# Patient Record
Sex: Female | Born: 1975 | Race: Black or African American | Hispanic: No | Marital: Single | Smoking: Never smoker
Health system: Southern US, Community
[De-identification: ages and names within clinical notes are randomized; demographics above are authoritative.]

---

## 2014-06-18 ENCOUNTER — Emergency Department (HOSPITAL_COMMUNITY)
Admission: EM | Admit: 2014-06-18 | Discharge: 2014-06-18 | Disposition: A | Discharge status: Home / Self Care | Payer: Medicaid Other | Attending: Emergency Medicine | Admitting: Emergency Medicine

## 2014-06-18 ENCOUNTER — Encounter (HOSPITAL_COMMUNITY): Payer: Self-pay | Admitting: Emergency Medicine

## 2014-06-18 DIAGNOSIS — M65832 Other synovitis and tenosynovitis, left forearm: Secondary | ICD-10-CM | POA: Insufficient documentation

## 2014-06-18 DIAGNOSIS — R200 Anesthesia of skin: Secondary | ICD-10-CM | POA: Diagnosis present

## 2014-06-18 DIAGNOSIS — M778 Other enthesopathies, not elsewhere classified: Secondary | ICD-10-CM

## 2014-06-18 DIAGNOSIS — R202 Paresthesia of skin: Secondary | ICD-10-CM | POA: Insufficient documentation

## 2014-06-18 MED ORDER — IBUPROFEN 400 MG PO TABS
800.0000 mg | ORAL_TABLET | Freq: Once | ORAL | Status: AC
  Administered 2014-06-18: 800 mg via ORAL
  Filled 2014-06-18: qty 2

## 2014-06-18 MED ORDER — IBUPROFEN 800 MG PO TABS: 800.0000 mg | ORAL_TABLET | Freq: Three times a day (TID) | ORAL | Status: DC | PRN

## 2014-06-18 MED ORDER — CYCLOBENZAPRINE HCL 10 MG PO TABS: 10.0000 mg | ORAL_TABLET | Freq: Three times a day (TID) | ORAL | Status: DC | PRN

## 2014-06-18 NOTE — ED Notes (Signed)
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  One week ago she began experiencing numbness and pain radiating from her elbows down to her hands.  Also experiencing neuropathy to pads of fingers.

## 2014-06-18 NOTE — Discharge Instructions (Signed)
Read the information below.  Use the prescribed medication as directed.  Please discuss all new medications with your pharmacist.  You may return to the Emergency Department at any time for worsening condition or any new symptoms that concern you.  If you develop uncontrolled pain, weakness or numbness of the extremity, severe discoloration of the skin, or you are unable to move your hands, return to the ER for a recheck.       Paresthesia Paresthesia is a burning or prickling feeling. This feeling can happen in any part of the body. It often happens in the hands, arms, legs, or feet. HOME CARE  Avoid drinking alcohol.  Try massage or needle therapy (acupuncture) to help with your problems.  Keep all doctor visits as told. GET HELP RIGHT AWAY IF:   You feel weak.  You have trouble walking or moving.  You have problems speaking or seeing.  You feel confused.  You cannot control when you poop (bowel movement) or pee (urinate).  You lose feeling (numbness) after an injury.  You pass out (faint).  Your burning or prickling feeling gets worse when you walk.  You have pain, cramps, or feel dizzy.  You have a rash. MAKE SURE YOU:   Understand these instructions.  Will watch your condition.  Will get help right away if you are not doing well or get worse. Document Released: 07/23/2008 Document Revised: 11/02/2011 Document Reviewed: 05/01/2011 [facility] Patient Information 2015 [place], [place]. This information is not intended to replace advice given to you by your health care provider. Make sure you discuss any questions you have with your health care provider.

## 2014-06-18 NOTE — ED Notes (Signed)
Called ortho and they are on their way with elbow brace.

## 2014-06-18 NOTE — ED Provider Notes (Signed)
Medical screening examination/treatment/procedure(s) were performed by non-physician practitioner and as supervising physician I was immediately available for consultation/collaboration.   EKG Interpretation None       Jacquelene Kopecky, MD 06/18/14 1734 

## 2014-06-18 NOTE — Progress Notes (Signed)
Orthopedic Tech Progress Note Patient Details:  Connie Kelly Sep 16, 1975 [id number] Applied tennis elbow brace to LUE. Ortho Devices Type of Ortho Device: Other (comment) Ortho Device/Splint Location: LUE Ortho Device/Splint Interventions: Application   Lesle ChrisGilliland, Ron Junco L 06/18/2014, 12:06 PM

## 2014-06-18 NOTE — ED Notes (Signed)
C/O bilateral hand numbness, left elbow and wrist pain, since starting new job 2 weeks ago.

## 2014-06-18 NOTE — ED Provider Notes (Signed)
CSN: [id number]     Arrival date & time 06/18/14  [id number] History  This chart was scribed for non-physician practitioner Trixie DredgeEmily Victoriana Aziz, PA-C working with Doug SouSam Jacubowitz, MD by Leone PayorSonum Patel, ED Scribe. This patient was seen in room TR05C/TR05C and the patient's care was started at 10:06 AM.    Chief Complaint  Patient presents with  . bil arm numbness    The history is provided by the patient. No language interpreter was used.   HPI Comments: Connie Kelly is a 38 y.o. female who presents to the Emergency Department complaining of 1 week of gradual onset, intermittent, gradually worsened bilateral elbow pain (L>R) that has progressed into elbow pain with radiation into wrists with associated altered sensations to the pads of all 10 fingers. She reports working at a [employer] which requires her to use repetitive motions. She denies recent falls or injuries. She has not taken any OTC medications for her symptoms. She denies prior joint pain, polyuria, polydipsia, neck pain. Denies fevers, weakness of the hands.  History reviewed. No pertinent past medical history. History reviewed. No pertinent past surgical history. No family history on file. History  Substance Use Topics  . Smoking status: Never Smoker   . Smokeless tobacco: Not on file  . Alcohol Use: No   OB History   Grav Para Term Preterm Abortions TAB SAB Ect Mult Living                 Review of Systems  Constitutional: Negative for fever.  Endocrine: Negative for polydipsia and polyuria.  Musculoskeletal: Positive for arthralgias. Negative for neck pain.  Skin: Negative for color change and wound.  Allergic/Immunologic: Negative for immunocompromised state.  Neurological: Positive for numbness. Negative for weakness.  Hematological: Does not bruise/bleed easily.      Allergies  Review of patient's allergies indicates no known allergies.  Home Medications   Prior to Admission medications   Not on File   BP 148/86   Pulse 73  Temp(Src) 98.6 F (37 C) (Oral)  Resp 20  Ht 5\' 3"  (1.6 m)  Wt 170 lb (77.111 kg)  BMI 30.12 kg/m2  SpO2 100%  LMP 06/07/2014 Physical Exam  Nursing note and vitals reviewed. Constitutional: She appears well-developed and well-nourished. No distress.  HENT:  Head: Normocephalic and atraumatic.  Neck: Neck supple.  Cardiovascular: Intact distal pulses.   Pulmonary/Chest: Effort normal.  Musculoskeletal: Normal range of motion. She exhibits no edema and no tenderness.       Arms: Upper extremities:  Strength 5/5, sensation intact but altered throughout the left hand, distal pulses intact.    Tinnel and phalen tests increased symptoms, but in the 1st and 2nd fingers.    Neurological: She is alert.  Skin: She is not diaphoretic.    ED Course  Procedures (including critical care time)  DIAGNOSTIC STUDIES: Oxygen Saturation is 100% on RA, normal by my interpretation.    COORDINATION OF CARE: 10:12 AM Discussed treatment plan with pt at bedside and pt agreed to plan.   Labs Review Labs Reviewed - No data to display  Imaging Review No results found.   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  Suspect lateral epicondylitis vs nerve impingement.  The tingling in her hands is not dermatomal.  Vascularly intact.   D/C home with motrin, flexeril , PCP follow up, hand surgery follow up for worsening symptoms.  Discussed result, findings, treatment, and follow up  with patient.  Pt given return precautions.  Pt verbalizes understanding and agrees with plan.       I personally performed the services described in this documentation, which was scribed in  my presence. The recorded information has been reviewed and is accurate.   Trixie Dredgemily Chidera Dearcos, PA-C 06/18/14 1537  Rio OsoEmily Alyda Megna, [place] JerseyPA-C 06/18/14 1537

## 2015-03-07 ENCOUNTER — Emergency Department (HOSPITAL_COMMUNITY)
Admission: EM | Admit: 2015-03-07 | Discharge: 2015-03-07 | Disposition: A | Discharge status: Home / Self Care | Payer: Medicaid Other | Attending: Emergency Medicine | Admitting: Emergency Medicine

## 2015-03-07 ENCOUNTER — Encounter (HOSPITAL_COMMUNITY): Payer: Self-pay | Admitting: Neurology

## 2015-03-07 DIAGNOSIS — M546 Pain in thoracic spine: Secondary | ICD-10-CM | POA: Insufficient documentation

## 2015-03-07 DIAGNOSIS — R109 Unspecified abdominal pain: Secondary | ICD-10-CM | POA: Diagnosis not present

## 2015-03-07 LAB — COMPREHENSIVE METABOLIC PANEL
ALK PHOS: 40 U/L (ref 38–126)
ALT: 14 U/L (ref 14–54)
AST: 16 U/L (ref 15–41)
Albumin: 3.8 g/dL (ref 3.5–5.0)
Anion gap: 6 (ref 5–15)
BUN: 10 mg/dL (ref 6–20)
CO2: 24 mmol/L (ref 22–32)
Calcium: 9.3 mg/dL (ref 8.9–10.3)
Chloride: 109 mmol/L (ref 101–111)
Creatinine, Ser: 0.97 mg/dL (ref 0.44–1.00)
GFR calc non Af Amer: >60 mL/min (ref >60)
GLUCOSE: 92 mg/dL (ref 65–99)
Potassium: 4.4 mmol/L (ref 3.5–5.1)
SODIUM: 139 mmol/L (ref 135–145)
TOTAL PROTEIN: 6.6 g/dL (ref 6.5–8.1)
Total Bilirubin: 0.5 mg/dL (ref 0.3–1.2)

## 2015-03-07 LAB — CBC
HCT: 36.4 % (ref 36.0–46.0)
Hemoglobin: 11.7 g/dL — ABNORMAL LOW (ref 12.0–15.0)
MCH: 29.9 pg (ref 26.0–34.0)
MCHC: 32.1 g/dL (ref 30.0–36.0)
MCV: 93.1 fL (ref 78.0–100.0)
Platelets: 244 10*3/uL (ref 150–400)
RBC: 3.91 MIL/uL (ref 3.87–5.11)
RDW: 13.9 % (ref 11.5–15.5)
WBC: 4.9 10*3/uL (ref 4.0–10.5)

## 2015-03-07 LAB — I-STAT BETA HCG BLOOD, ED (MC, WL, AP ONLY): I-stat hCG, quantitative: <5 m[IU]/mL (ref <5)

## 2015-03-07 LAB — LIPASE, BLOOD: Lipase: 24 U/L (ref 22–51)

## 2015-03-07 MED ORDER — KETOROLAC TROMETHAMINE 60 MG/2ML IM SOLN
60.0000 mg | Freq: Once | INTRAMUSCULAR | Status: AC
  Administered 2015-03-07: 60 mg via INTRAMUSCULAR
  Filled 2015-03-07: qty 2

## 2015-03-07 MED ORDER — NAPROXEN 500 MG PO TABS: 500.0000 mg | ORAL_TABLET | Freq: Two times a day (BID) | ORAL | Status: AC

## 2015-03-07 NOTE — ED Provider Notes (Signed)
CSN: [id number]     Arrival date & time 03/07/15  1543 History  This chart was scribed for Joycie PeekBenjamin Paisley Grajeda, PA-C, working with Mirian MoMatthew Gentry, MD by Chestine SporeSoijett Blue, ED Scribe. The patient was seen in room TR02C/TR02C at 6:52 PM.    Chief Complaint  Patient presents with  . Back Pain      The history is provided by the patient. No language interpreter was used.    HPI Comments: Connie Slipperonia Hartsfield is a 39 y.o. female who presents to the Emergency Department complaining of constant left middle back pain onset yesterday. She reports that the left middle back pain does radiate to her LUQ. Pt reports that it feels like a muscle spasm and she tried to go to work but the pain was too much. Pt notes that when she breathes in there is pain to the area. Pt reports that she has back pain with movement and rates her pain as 8/10. She states that she has tried ibuprofen last night with no relief for her symptoms. Pt denies leg swelling, hemoptysis, and any other symptoms. Denies recent travels, surgeries, taking birth control, and blood clots.    History reviewed. No pertinent past medical history. History reviewed. No pertinent past surgical history. No family history on file. History  Substance Use Topics  . Smoking status: Never Smoker   . Smokeless tobacco: Not on file  . Alcohol Use: No   OB History    No data available     Review of Systems  Cardiovascular: Negative for leg swelling.  Gastrointestinal: Positive for abdominal pain. Negative for vomiting.  Genitourinary: Negative for difficulty urinating.  Musculoskeletal: Positive for back pain.  All other systems reviewed and are negative.     Allergies  Peanut-containing drug products and Percocet  Home Medications   Prior to Admission medications   Medication Sig Start Date End Date Taking? Authorizing Provider  ibuprofen (ADVIL,MOTRIN) 200 MG tablet Take 400 mg by mouth every 6 (six) hours as needed for mild pain.   Yes Historical  Provider, MD  naproxen (NAPROSYN) 500 MG tablet Take 1 tablet (500 mg total) by mouth 2 (two) times daily. 03/07/15   Joycie PeekBenjamin Kenyan Karnes, PA-C   BP 140/74 mmHg  Pulse 72  Temp(Src) 98.4 F (36.9 C) (Oral)  Resp 20  SpO2 100%  LMP 02/22/2015 Physical Exam  Constitutional: She is oriented to person, place, and time. She appears well-developed and well-nourished. No distress.  HENT:  Head: Normocephalic and atraumatic.  Eyes: EOM are normal.  Neck: Neck supple. No tracheal deviation present.  Cardiovascular: Normal rate, regular rhythm and normal heart sounds.  Exam reveals no gallop and no friction rub.   No murmur heard. Pulmonary/Chest: Effort normal and breath sounds normal. No respiratory distress. She has no wheezes. She has no rales.  Abdominal: Soft. There is no tenderness.  Musculoskeletal: Normal range of motion.  TTP in mid thoracic paraspinal muscles and intercostal spaces. No crepitus or bony step-offs.  Neurological: She is alert and oriented to person, place, and time.  Skin: Skin is warm and dry.  No rash noted.   Psychiatric: She has a normal mood and affect. Her behavior is normal.  Nursing note and vitals reviewed.   ED Course  Procedures (including critical care time) DIAGNOSTIC STUDIES: Oxygen Saturation is 100% on RA, nl by my interpretation.    COORDINATION OF CARE: 6:56 PM-Discussed treatment plan which includes with pt at bedside and pt agreed to plan.   Labs Review  Labs Reviewed  CBC - Abnormal; Notable for the following:    Hemoglobin 11.7 (*)    All other components within normal limits  LIPASE, BLOOD  COMPREHENSIVE METABOLIC PANEL  URINALYSIS, ROUTINE W REFLEX MICROSCOPIC (NOT AT [facility])  I-STAT BETA HCG BLOOD, ED ([facility], [facility], [facility] ONLY)    Imaging Review No results found.   EKG Interpretation None     Meds given in ED:  Medications  ketorolac (TORADOL) injection 60 mg (not administered)    New Prescriptions   NAPROXEN (NAPROSYN) 500 MG  TABLET    Take 1 tablet (500 mg total) by mouth 2 (two) times daily.   Filed Vitals:   03/07/15 1606  BP: 140/74  Pulse: 72  Temp: 98.4 F (36.9 C)  TempSrc: Oral  Resp: 20  SpO2: 100%    MDM  Vitals stable - WNL -afebrile Pt resting comfortably in ED. PE--physical exam is not concerning it is consistent with musculoskeletal injury. Labwork--labs not concerning  DDX--exam consistent with musculoskeletal etiology. No evidence of shingles, pyelonephritis, other cardiopulmonary pathology. Patient is PERC negative. Likely costochondritis versus other musculoskeletal injury. Will DC with anti-inflammatory's and instructions to follow-up with primary care.  I discussed all relevant lab findings and imaging results with pt and they verbalized understanding. Discussed f/u with PCP within 48 hrs and return precautions, pt very amenable to plan.  Final diagnoses:  Left-sided thoracic back pain  I personally performed the services described in this documentation, which was scribed in my presence. The recorded information has been reviewed and is accurate.       Joycie Peek, PA-C 03/07/15 1937  Mirian Mo, MD 03/08/15 [phone]

## 2015-03-07 NOTE — Discharge Instructions (Signed)
Please take your medications as prescribed. Follow-up with your doctors for further evaluation. Return to ED for any worsening symptoms.  Back Pain, Adult Low back pain is very common. About 1 in 5 people have back pain.The cause of low back pain is rarely dangerous. The pain often gets better over time.About half of people with a sudden onset of back pain feel better in just 2 weeks. About 8 in 10 people feel better by 6 weeks.  CAUSES Some common causes of back pain include:  Strain of the muscles or ligaments supporting the spine.  Wear and tear (degeneration) of the spinal discs.  Arthritis.  Direct injury to the back. DIAGNOSIS Most of the time, the direct cause of low back pain is not known.However, back pain can be treated effectively even when the exact cause of the pain is unknown.Answering your caregiver's questions about your overall health and symptoms is one of the most accurate ways to make sure the cause of your pain is not dangerous. If your caregiver needs more information, he or she may order lab work or imaging tests (X-rays or MRIs).However, even if imaging tests show changes in your back, this usually does not require surgery. HOME CARE INSTRUCTIONS For many people, back pain returns.Since low back pain is rarely dangerous, it is often a condition that people can learn to [facility] their own.   Remain active. It is stressful on the back to sit or stand in one place. Do not sit, drive, or stand in one place for more than 30 minutes at a time. Take short walks on level surfaces as soon as pain allows.Try to increase the length of time you walk each day.  Do not stay in bed.Resting more than 1 or 2 days can delay your recovery.  Do not avoid exercise or work.Your body is made to move.It is not dangerous to be active, even though your back may hurt.Your back will likely heal faster if you return to being active before your pain is gone.  Pay attention to your body  when you bend and lift. Many people have less discomfortwhen lifting if they bend their knees, keep the load close to their bodies,and avoid twisting. Often, the most comfortable positions are those that put less stress on your recovering back.  Find a comfortable position to sleep. Use a firm mattress and lie on your side with your knees slightly bent. If you lie on your back, put a pillow under your knees.  Only take over-the-counter or prescription medicines as directed by your caregiver. Over-the-counter medicines to reduce pain and inflammation are often the most helpful.Your caregiver may prescribe muscle relaxant drugs.These medicines help dull your pain so you can more quickly return to your normal activities and healthy exercise.  Put ice on the injured area.  Put ice in a plastic bag.  Place a towel between your skin and the bag.  Leave the ice on for 15-20 minutes, 03-04 times a day for the first 2 to 3 days. After that, ice and heat may be alternated to reduce pain and spasms.  Ask your caregiver about trying back exercises and gentle massage. This may be of some benefit.  Avoid feeling anxious or stressed.Stress increases muscle tension and can worsen back pain.It is important to recognize when you are anxious or stressed and learn ways to manage it.Exercise is a great option. SEEK MEDICAL CARE IF:  You have pain that is not relieved with rest or medicine.  You have  pain that does not improve in 1 week.  You have new symptoms.  You are generally not feeling well. SEEK IMMEDIATE MEDICAL CARE IF:   You have pain that radiates from your back into your legs.  You develop new bowel or bladder control problems.  You have unusual weakness or numbness in your arms or legs.  You develop nausea or vomiting.  You develop abdominal pain.  You feel faint. Document Released: 08/10/2005 Document Revised: 02/09/2012 Document Reviewed: 12/12/2013 [facility] Patient  Information 2015 [place], [place]. This information is not intended to replace advice given to you by your health care provider. Make sure you discuss any questions you have with your health care provider.

## 2015-03-07 NOTE — ED Notes (Addendum)
Pt reports left middle back pain with spasms that radiate to LUQ. Pain is in LUQat current. Denies vomiting. Pt is a x 4. Has been feeling nauseated.

## 2015-03-18 ENCOUNTER — Encounter (HOSPITAL_COMMUNITY): Payer: Self-pay | Admitting: Emergency Medicine

## 2015-03-18 ENCOUNTER — Emergency Department (HOSPITAL_COMMUNITY)
Admission: EM | Admit: 2015-03-18 | Discharge: 2015-03-18 | Disposition: A | Discharge status: Home / Self Care | Payer: Medicaid Other | Attending: Emergency Medicine | Admitting: Emergency Medicine

## 2015-03-18 ENCOUNTER — Emergency Department (HOSPITAL_COMMUNITY): Payer: Medicaid Other

## 2015-03-18 DIAGNOSIS — Y939 Activity, unspecified: Secondary | ICD-10-CM | POA: Diagnosis not present

## 2015-03-18 DIAGNOSIS — S79911A Unspecified injury of right hip, initial encounter: Secondary | ICD-10-CM | POA: Diagnosis present

## 2015-03-18 DIAGNOSIS — S76011A Strain of muscle, fascia and tendon of right hip, initial encounter: Secondary | ICD-10-CM

## 2015-03-18 DIAGNOSIS — Z791 Long term (current) use of non-steroidal anti-inflammatories (NSAID): Secondary | ICD-10-CM | POA: Insufficient documentation

## 2015-03-18 DIAGNOSIS — Y998 Other external cause status: Secondary | ICD-10-CM | POA: Insufficient documentation

## 2015-03-18 DIAGNOSIS — Y929 Unspecified place or not applicable: Secondary | ICD-10-CM | POA: Diagnosis not present

## 2015-03-18 DIAGNOSIS — X58XXXA Exposure to other specified factors, initial encounter: Secondary | ICD-10-CM | POA: Diagnosis not present

## 2015-03-18 MED ORDER — IBUPROFEN 400 MG PO TABS
800.0000 mg | ORAL_TABLET | Freq: Once | ORAL | Status: AC
  Administered 2015-03-18: 800 mg via ORAL
  Filled 2015-03-18: qty 2

## 2015-03-18 MED ORDER — IBUPROFEN 800 MG PO TABS: 800.0000 mg | ORAL_TABLET | Freq: Three times a day (TID) | ORAL | Status: AC

## 2015-03-18 MED ORDER — METHOCARBAMOL 500 MG PO TABS: 1000.0000 mg | ORAL_TABLET | Freq: Two times a day (BID) | ORAL | Status: AC

## 2015-03-18 NOTE — ED Notes (Signed)
Patient states R hip pain that started on Sat and has progressively gotten worse.  Patient complains that R hip hurts with shooting pains to knee.   Patient states no relief at home.

## 2015-03-18 NOTE — Discharge Instructions (Signed)
Hip Pain Your hip is the joint between your upper legs and your lower pelvis. The bones, cartilage, tendons, and muscles of your hip joint perform a lot of work each day supporting your body weight and allowing you to move around. Hip pain can range from a minor ache to severe pain in one or both of your hips. Pain may be felt on the inside of the hip joint near the groin, or the outside near the buttocks and upper thigh. You may have swelling or stiffness as well.  HOME CARE INSTRUCTIONS   Take medicines only as directed by your health care provider.  Apply ice to the injured area:  Put ice in a plastic bag.  Place a towel between your skin and the bag.  Leave the ice on for 15-20 minutes at a time, 3-4 times a day.  Keep your leg raised (elevated) when possible to lessen swelling.  Avoid activities that cause pain.  Follow specific exercises as directed by your health care provider.  Sleep with a pillow between your legs on your most comfortable side.  Record how often you have hip pain, the location of the pain, and what it feels like. SEEK MEDICAL CARE IF:   You are unable to put weight on your leg.  Your hip is red or swollen or very tender to touch.  Your pain or swelling continues or worsens after 1 week.  You have increasing difficulty walking.  You have a fever. SEEK IMMEDIATE MEDICAL CARE IF:   You have fallen.  You have a sudden increase in pain and swelling in your hip. MAKE SURE YOU:   Understand these instructions.  Will watch your condition.  Will get help right away if you are not doing well or get worse. Document Released: 01/28/2010 Document Revised: 12/25/2013 Document Reviewed: 04/06/2013 [facility] Patient Information 2015 [place], [place]. This information is not intended to replace advice given to you by your health care provider. Make sure you discuss any questions you have with your health care provider.   Emergency Department Resource  Guide 1) Find a Doctor and Pay Out of Pocket Although you won't have to find out who is covered by your insurance plan, it is a good idea to ask around and get recommendations. You will then need to call the office and see if the doctor you have chosen will accept you as a new patient and what types of options they offer for patients who are self-pay. Some doctors offer discounts or will set up payment plans for their patients who do not have insurance, but you will need to ask so you aren't surprised when you get to your appointment.  2) Contact Your Local Health Department Not all health departments have doctors that can see patients for sick visits, but many do, so it is worth a call to see if yours does. If you don't know where your local health department is, you can check in your phone book. The CDC also has a tool to help you locate your state's health department, and many state websites also have listings of all of their local health departments.  3) Find a Walk-in Clinic If your illness is not likely to be very severe or complicated, you may want to try a walk in clinic. These are popping up all over the country in pharmacies, drugstores, and shopping centers. They're usually staffed by nurse practitioners or physician assistants that have been trained to treat common illnesses and complaints. They're usually  fairly quick and inexpensive. However, if you have serious medical issues or chronic medical problems, these are probably not your best option.  No Primary Care Doctor: - Call Health Connect at  [phone] - they can help you locate a primary care doctor that  accepts your insurance, provides certain services, etc. - Physician Referral Service- [phone]  Chronic Pain Problems: Organization         Address  Phone   Notes  [facility]  [phone] Patients need to be referred by their primary care doctor.   Medication Assistance: Organization          Address  Phone   Notes  [facility] Medication [facility] [address] [place] [place], [place] [address] [phone] --Must be a resident of [facility] -- Must have NO insurance coverage whatsoever (no Medicaid/ Medicare, etc.) -- The pt. MUST have a primary care doctor that directs their care regularly and follows them in the community   [facility]  [phone]   [employer]  [phone]    Agencies that provide inexpensive medical care: Organization         Address  Phone   Notes  [facility]  [phone]   [facility]    [phone]   [facility] [address] [place], [place] [address] [phone]   [facility] [address] [place] [place] [phone]   Planned Parenthood    [phone]   [facility]    [phone]   [facility]  [address], [place] Phone:  [phone], Fax:  [phone] Hours of Operation:  9 am - 6 pm, M-F.  Also accepts Medicaid/Medicare and self-pay.  [facility]  [address], [place] Phone: [phone], Fax: [phone]. Hours of Operation:  8:30 am - 5:30 pm, M-F.  Also accepts Medicaid and self-pay.  [facility] [place] [address], [place] Phone: [phone]   [facility] [address] Natasha Bence [place], [place] [phone], Ext. [phone] Mondays & Thursdays: 7-9 AM.  First 15 patients are seen on a first come, first serve basis.    Medicaid-accepting [facility] Providers:  Organization         Address  Phone   Notes  [facility] [address], [place] [phone] Also accepts self-pay patients.  [facility] [address] Laurell Josephs [place], [place]  [phone]   [facility] [address], [place] [phone]   [facility] [address] [place] [phone]   Renaye Rakers [address], [place]   [phone] Only accepts [place] Access [place] patients after they have their name applied to their card.   Self-Pay (no insurance) in [facility]:  Organization         Address  Phone   Notes  Sickle Cell Patients, [facility] [address] [place], [place] [phone]   [facility] [address] [place], [place] [phone]   [facility] [place]  [address], [place] [phone]   [facility]/Dr. Osei-Bonsu  [address] [place] or [address], [place] [phone] Phone  number for both [place] and [place] locations is the same.  [facility] [address] [place] [phone]   [facility] [address], [place] or [address] [phone]   [facility] [address] [place] [phone], phone; [phone], fax Sees patients 1st and 3rd Saturday of every month.  Must not qualify for public or private insurance (i.e. Medicaid, Medicare, [place] Health Choice, Veterans' Benefits)  Household income should be no more than 200% of the poverty level The clinic cannot treat you if you are pregnant or think you are pregnant  Sexually transmitted diseases are not treated at the clinic.    Dental Care: Organization         Address  Phone  Notes  [facility] Department of [facility] [address] [place], [place] [phone] Accepts children up to age 56 who are enrolled in [place] or [place] Health Choice; pregnant women with a Medicaid card; and children who have applied for Medicaid or [place] Health Choice, but were declined, whose parents can pay a reduced fee at time of service.  [facility] Department of [facility]  [address], [place] [phone] Accepts children up to age 58 who are enrolled in [place] or [place] Health Choice; pregnant women with a Medicaid card; and children who have applied for Medicaid or [place] Health Choice, but were declined, whose parents can pay a reduced fee at time of service.  [facility]  [address] [place], [place] [phone] Patients are seen by appointment only. Walk-ins are not accepted. [facility] will see patients 4 years of age and older. Monday - Tuesday (8am-5pm) Most Wednesdays (8:30-5pm) $30 per visit, cash only  [facility] Adult Dental Access [facility]  [address], [facility] [phone] Patients are seen by appointment only. Walk-ins are not accepted. [facility] will see patients 46 years of age and older. One Wednesday Evening (Monthly: Volunteer Based).  $30 per visit, cash only  [employer]  [phone] for adults; Children under age 31, call Graduate Pediatric Dentistry at [phone]. Children aged 41-14, please call [phone] to request a pediatric application.  Dental services are provided in all areas of dental care including fillings, crowns and bridges, complete and partial dentures, implants, gum treatment, root canals, and extractions. Preventive care is also provided. Treatment is provided to both adults and children. Patients are selected via a lottery and there is often a waiting list.   [facility] [address] [place] [address], [place]  [phone] [email or web address]   [facility] [address] [place], [place] [phone], Ext. [phone] Second and Fourth Thursday of each month, opens at 6:30 AM; Clinic ends at 9 AM.  Patients are seen on a first-come first-served basis, and a limited number are seen during each clinic.   [facility]  [address] Ether [place], [place] [phone]   Eligibility Requirements You must  have lived in [place], [place], or [place] counties for at least the last three months.   You cannot be eligible for state or federal sponsored [place], including [employer], [place], or [employer].   You generally cannot be eligible for healthcare insurance through your employer.    How to apply: Eligibility screenings are held every Tuesday and Wednesday afternoon from 1:00 pm  until 4:00 pm. You do not need an appointment for the interview!  [facility] [address], [place], [place] [phone]   [facility] Health Department  [phone]   [facility] Health Department  [phone]   [facility] Health Department  [phone]    Behavioral Health Resources in the Community: Intensive Outpatient Programs Organization         Address  Phone  Notes  [place] [facility] [address] [place], [place] [phone]   [facility] Outpatient [address] [place], [place] [phone]   [facility] [address] [place], [place]  [phone]   [facility] Mental Health [address]  [place], [place] [phone] or [phone]   Substance Abuse Resources Organization         Address  Phone  Notes  [facility]  [phone]   [facility]  [phone]   [facility]  [phone]   Floydene Flock  [phone]   Residential & Outpatient Substance Abuse Program  [phone]   Psychological Services Organization         Address  Phone  Notes  [facility] Health  [phone]   [facility] Services  [phone]   [facility] Mental Health [address] [place] [phone] or [phone]    Mobile Crisis Teams Organization         Address  Phone  Notes  [facility], Mobile Crisis Care Unit  [phone]   [facility]  [address] [place], [place] [phone]   Doristine Locks [address] [place] [phone]    Self-Help/Support Groups Organization         Address  Phone             Notes  [facility] [place] - variety of support groups  336- I7437963 Call for more information  Narcotics Anonymous (NA), [facility] [address], [employer] [place]  2 meetings at this location   Statistician         Address  Phone  Notes  [facility] [address],    [place]  [phone]   [facility]  [address] [place] [address], [place], [place] [phone]   [facility] [address] [place], [place] [phone] Admissions: 8am-3pm M-F  [facility] [address],    [place], [place] [phone]   [facility] [address] [place], [place], [place] [phone]   [facility] [address]  [place], [place] [phone]   [facility] - Intensive Outpatient [address] Laurell Joseph[address], [place], [place] [phone]   [facility] ([facility] [address] [place].,  [place], [place] [phone] or [phone]   [facility] ([facility]) [address], [place], [place] [phone] Accepts Medicaid  [facility] [place] [address]  [place] [phone] Substance Abuse/Addiction Treatment   [facility] Organization         Address  Phone  Notes  [facility]  [phone]   Angie Fava, PhD [address] Ervin Knack [place], [place]   [phone] or [phone]   [facility]   [address] [place], [place] [phone]   [facility] [address] [place], [place] [phone] Insurance/Medicaid/sponsorship through [employer] and Families [address] [place] [phone] Therapy/tele-psych/case  [facility] [address]    [place], [place] [phone]    Dr. Lolly Mustache  [phone]   [facility]  [employer] [facility] Dept. 1) [address], [place] 2) [address] [place] 3)  [address], [place] [phone]  [phone]   [facility] Child Abuse Hotline 217 374 1867 or [phone] (After Hours)

## 2015-03-18 NOTE — ED Provider Notes (Signed)
CSN: [id number]     Arrival date & time 03/18/15  0728 History   First MD Initiated Contact with Patient 03/18/15 [phone]     Chief Complaint  Patient presents with  . Hip Pain     (Consider location/radiation/quality/duration/timing/severity/associated sxs/prior Treatment) HPI Patient is a 39 year old female past medical history of present ER complaining of right sided anterior hip pain. Patient reports she has been on her feet at work lately, working more than usual. Reports on Saturday having a gradual onset of anterior hip pain which has since progressed. Patient reports reproduction of her pain with range of motion of her hip, walking, bearing weight. Patient states she is tried ibuprofen without relief. Patient denies any specific injury to her hip, denies fever, nausea, vomiting, numbness, weakness. Patient states she is currently on her menses, states it is no chance she could be pregnant.  History reviewed. No pertinent past medical history. History reviewed. No pertinent past surgical history. No family history on file. History  Substance Use Topics  . Smoking status: Never Smoker   . Smokeless tobacco: Not on file  . Alcohol Use: No   OB History    No data available     Review of Systems  Musculoskeletal: Positive for arthralgias.  Neurological: Negative for weakness and numbness.      Allergies  Peanut-containing drug products and Percocet  Home Medications   Prior to Admission medications   Medication Sig Start Date End Date Taking? Authorizing Provider  ibuprofen (ADVIL,MOTRIN) 800 MG tablet Take 1 tablet (800 mg total) by mouth 3 (three) times daily. 03/18/15   Ladona Mow, PA-C  methocarbamol (ROBAXIN) 500 MG tablet Take 2 tablets (1,000 mg total) by mouth 2 (two) times daily. 03/18/15   Ladona Mow, PA-C  naproxen (NAPROSYN) 500 MG tablet Take 1 tablet (500 mg total) by mouth 2 (two) times daily. 03/07/15   Joycie Peek, PA-C   BP 121/71 mmHg  Pulse 74   Temp(Src) 97.8 F (36.6 C) (Oral)  Resp 20  Ht  (1.6 m)  Wt 160 lb (72.576 kg)  BMI 28.35 kg/m2  SpO2 100%  LMP 03/18/2015 Physical Exam  Constitutional: She appears well-developed and well-nourished. No distress.  HENT:  Head: Normocephalic and atraumatic.  Eyes: Conjunctivae are normal. Right eye exhibits no discharge. Left eye exhibits no discharge. No scleral icterus.  Cardiovascular:  Peripheral pulses intact at injured extremity.   Pulmonary/Chest: Effort normal. No respiratory distress.  Abdominal: No hernia. Hernia confirmed negative in the ventral area, confirmed negative in the right inguinal area and confirmed negative in the left inguinal area.  Musculoskeletal:  Right hip exam: No obvious effusion, erythema, ecchymosis, deformity. Anterior tenderness to palpation in the medial trochanteric region. Patient has painful range of motion of hip, pain exacerbated with external/internal rotation. Pain worsened with flexion and abduction. Patient has 5 out of 5 motor strength at hip, knee, ankle. DP pulse 2+. Distal sensation intact.  Neurological: She is alert.  No numbness distal to injury.    Skin: Skin is warm and dry. No rash noted. She is not diaphoretic.  Nursing note and vitals reviewed.   ED Course  Procedures (including critical care time) Labs Review Labs Reviewed - No data to display  Imaging Review Dg Hip Unilat With Pelvis 2-3 Views Right  03/18/2015   CLINICAL DATA:  Right hip pain, no known injury, initial encounter  EXAM: DG HIP (WITH OR WITHOUT PELVIS) 2-3V RIGHT  COMPARISON:  None.  FINDINGS: The  pelvic ring is intact. No acute fracture or dislocation is seen. No soft tissue changes are noted. Changes of prior tubal ligation are seen.  IMPRESSION: No acute abnormality noted.   Electronically Signed   By: Alcide Clever M.D.   On: 03/18/2015 08:33     EKG Interpretation None      MDM   Final diagnoses:  Strain of flexor muscle of hip, right,  initial encounter    3 days of anterior right-sided hip pain. Reproducible with flexion of hip, external and internal rotation. Patient afebrile, hemodynamically stable and the acute distress.  Likely musculoskeletal strain secondary to overuse injury. No concern for septic arthritis. No concern for gout, no concern for abdominal hernia on exam. X-ray unremarkable for acute pathology. Patient neurovascularly intact. Provided symptomatically relief here, encouraged patient to follow up orthopedics. Return precautions discussed, patient verbalizes understanding and agreement of this plan.  BP 121/71 mmHg  Pulse 74  Temp(Src) 97.8 F (36.6 C) (Oral)  Resp 20  Ht 5\' 3"  (1.6 m)  Wt 160 lb (72.576 kg)  BMI 28.35 kg/m2  SpO2 100%  LMP 03/18/2015  Signed,  Ladona Mow, PA-C 5:24 PM   Ladona Mow, PA-C 03/18/15 1724  Richardean [place], MD 03/18/15 1944

## 2015-05-14 ENCOUNTER — Encounter: Payer: Medicaid Other | Admitting: Family Medicine

## 2015-06-11 ENCOUNTER — Encounter: Payer: Medicaid Other | Admitting: Obstetrics & Gynecology

## 2016-12-16 IMAGING — DX DG HIP (WITH OR WITHOUT PELVIS) 2-3V*R*
3 series · 3 of 3 positions shown · non-contrast
Comparison: None.

CLINICAL DATA: Right hip pain, no known injury, initial encounter

EXAM:
DG HIP (WITH OR WITHOUT PELVIS) 2-3V RIGHT

[pelvis ap]
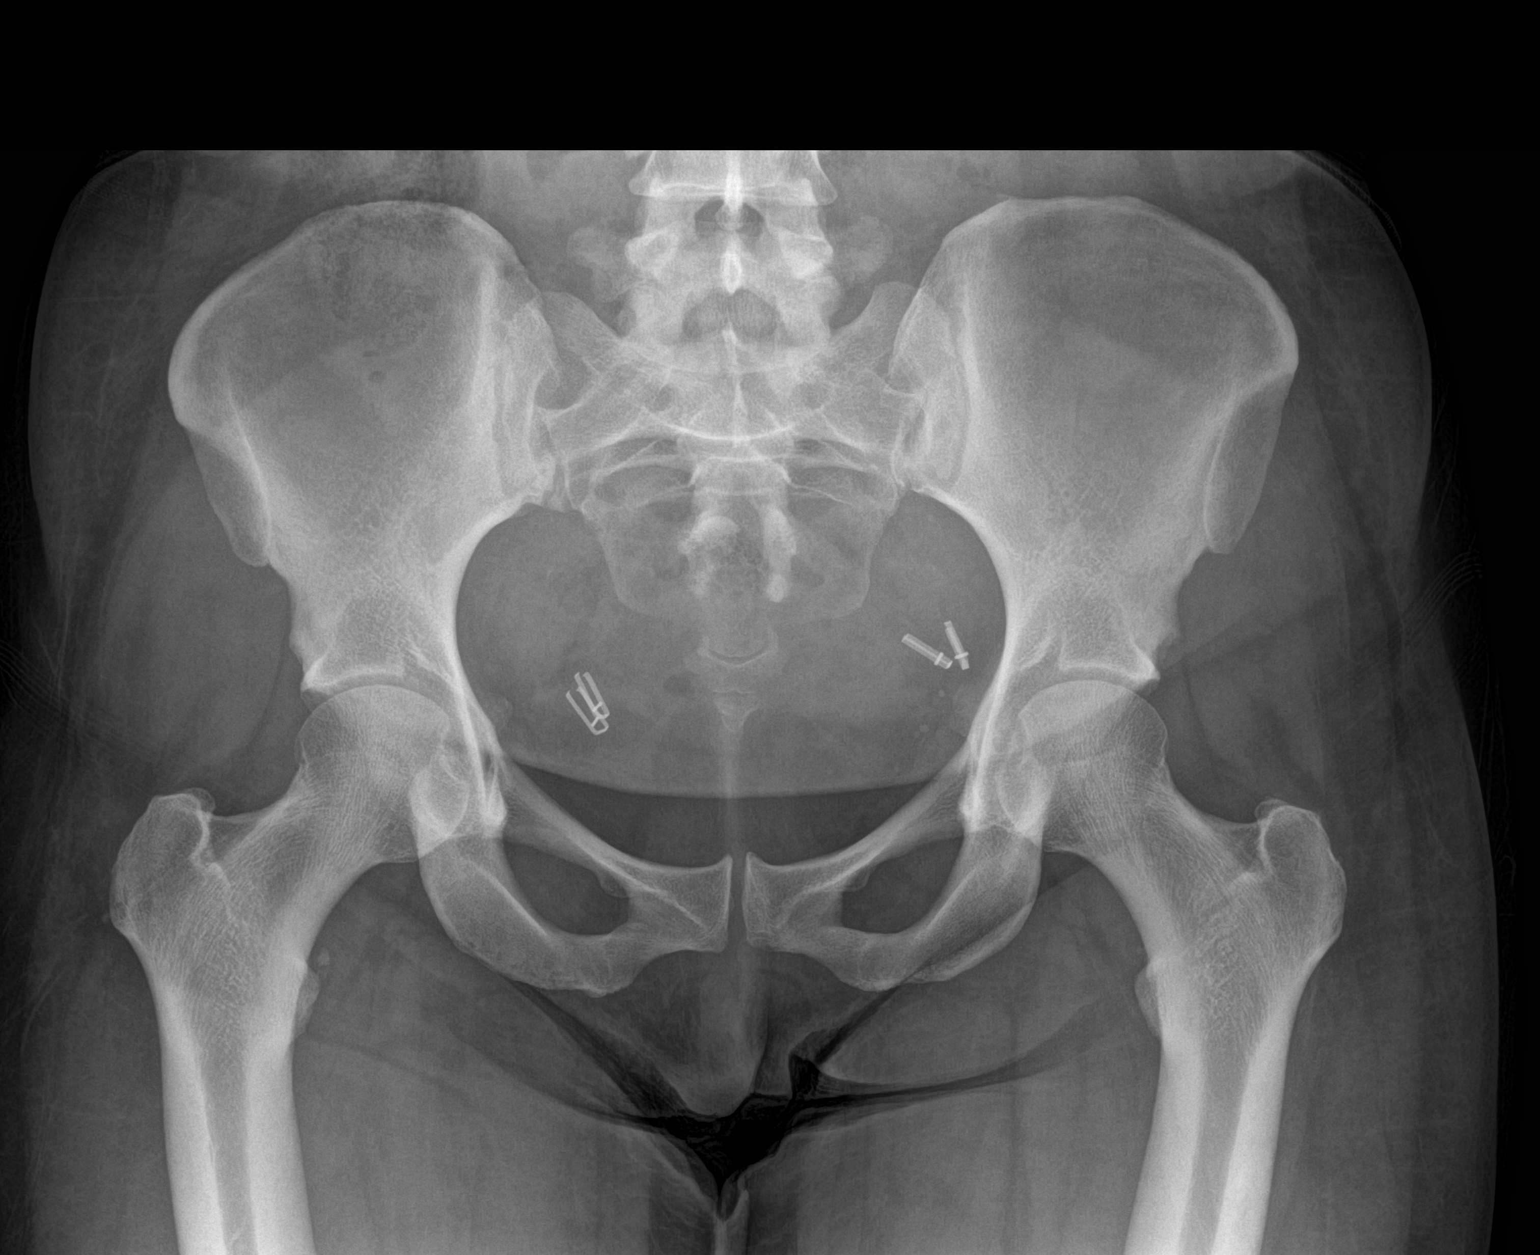

[hip ap]
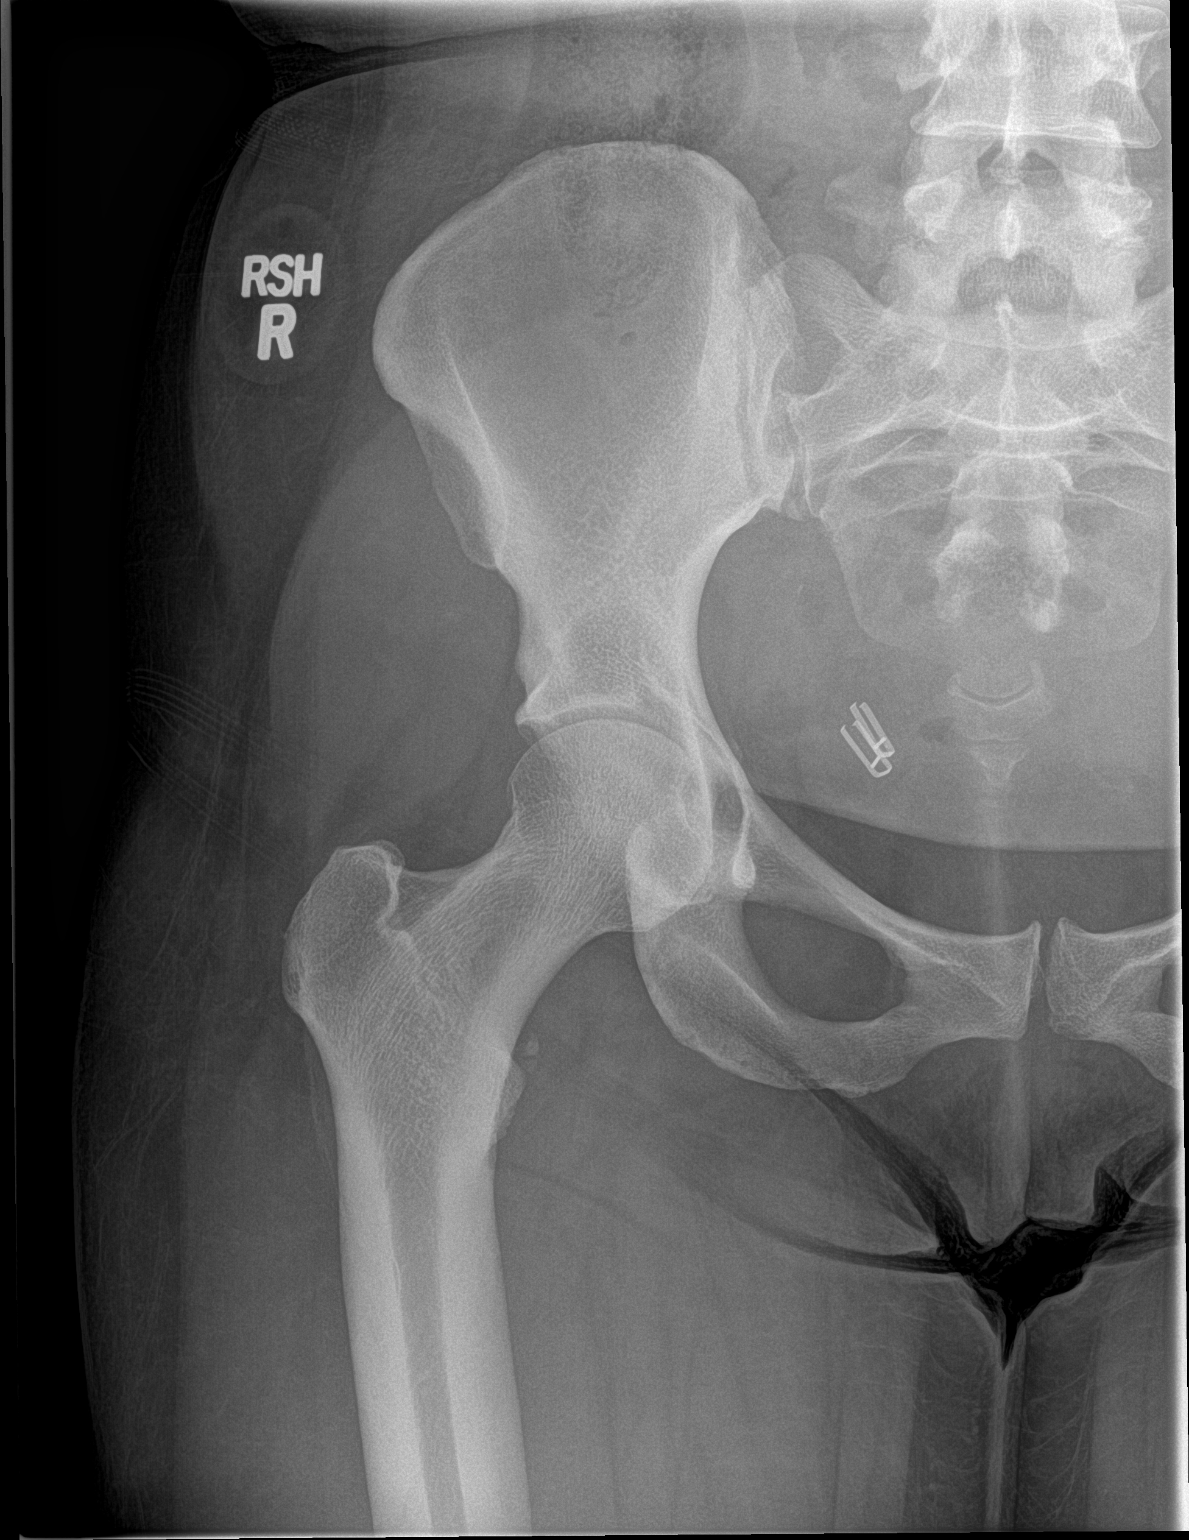

[hip lat]
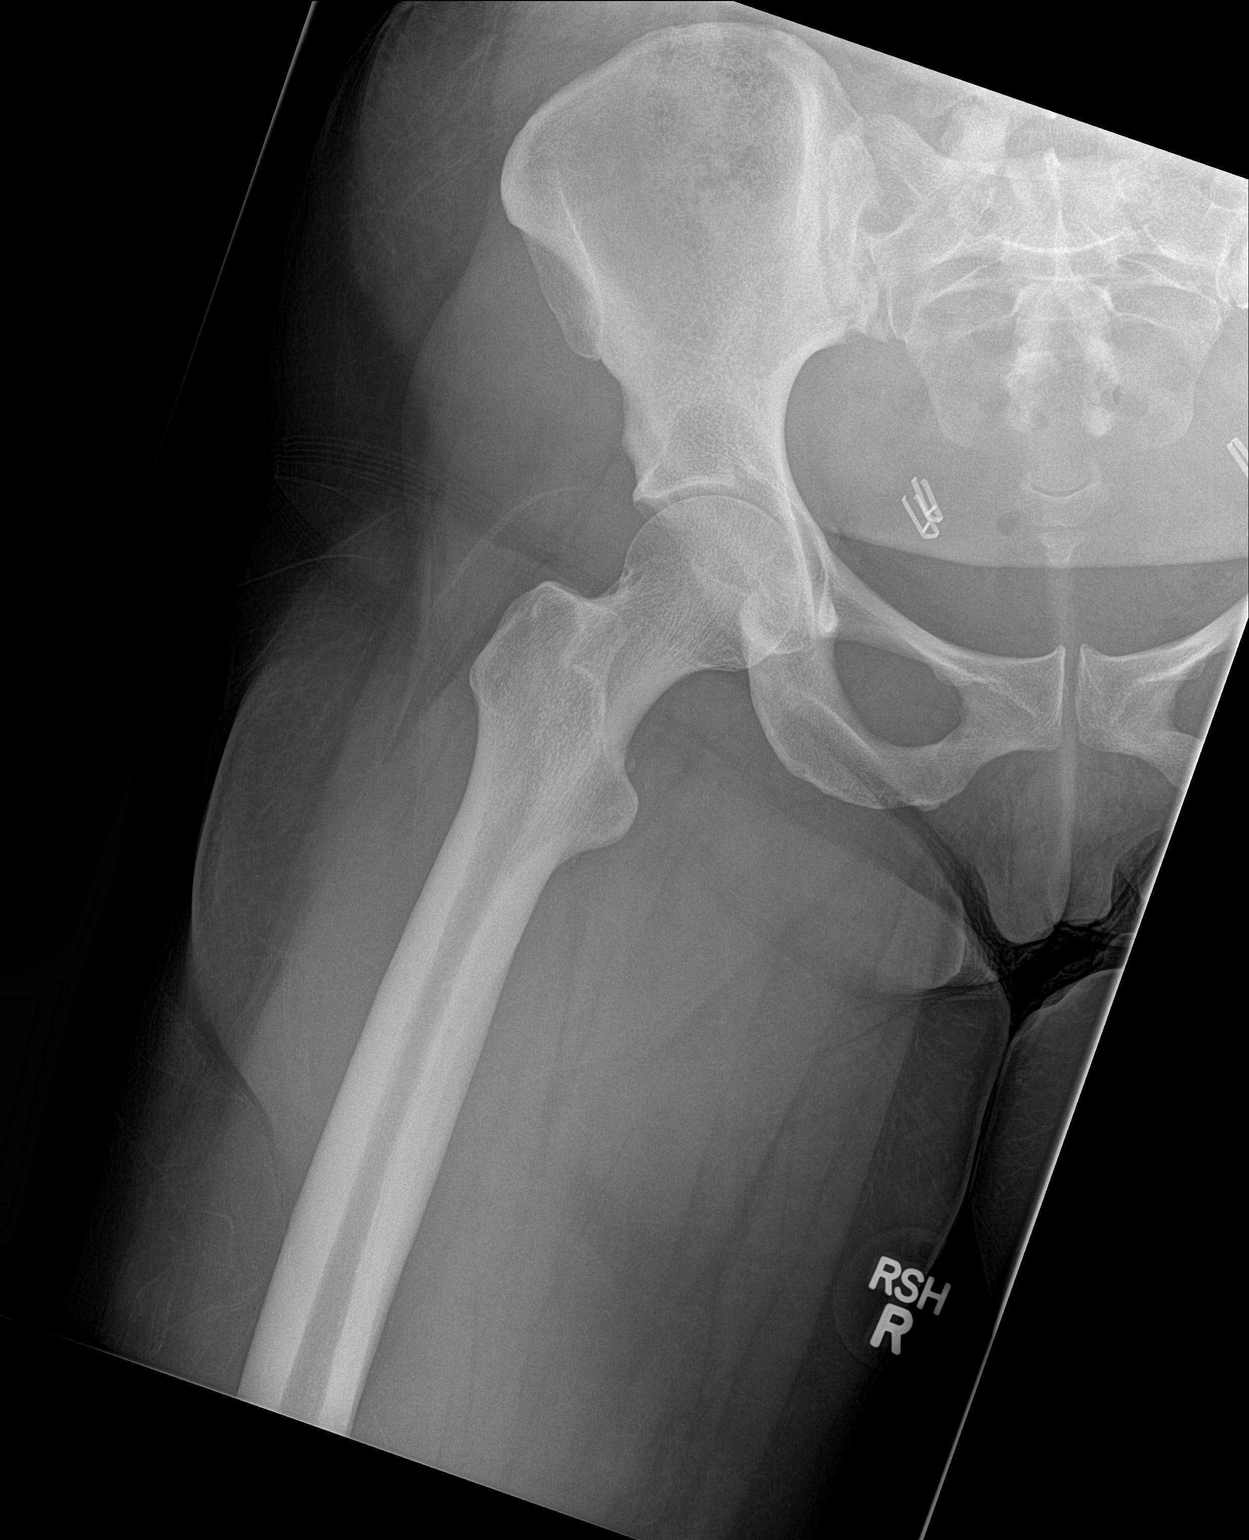

[3 of 3 positions shown; findings below may reference images not displayed]

FINDINGS: The pelvic ring is intact. No acute fracture or dislocation is seen.
No soft tissue changes are noted. Changes of prior tubal ligation
are seen.
IMPRESSION: No acute abnormality noted.
